# Patient Record
Sex: Female | Born: 1990
Health system: Southern US, Community
[De-identification: ages and names within clinical notes are randomized; demographics above are authoritative.]

## PROBLEM LIST (undated history)

## (undated) DIAGNOSIS — F411 Generalized anxiety disorder: Secondary | ICD-10-CM

## (undated) DIAGNOSIS — D649 Anemia, unspecified: Secondary | ICD-10-CM

## (undated) DIAGNOSIS — R002 Palpitations: Secondary | ICD-10-CM

## (undated) HISTORY — DX: Palpitations: R00.2

## (undated) HISTORY — DX: Generalized anxiety disorder: F41.1

## (undated) HISTORY — DX: Anemia, unspecified: D64.9

---

## 2019-06-19 ENCOUNTER — Encounter: Payer: Self-pay | Admitting: Cardiovascular Disease

## 2019-07-06 ENCOUNTER — Encounter: Payer: Self-pay | Admitting: *Deleted

## 2019-07-09 ENCOUNTER — Other Ambulatory Visit: Payer: Self-pay

## 2019-07-09 ENCOUNTER — Ambulatory Visit (INDEPENDENT_AMBULATORY_CARE_PROVIDER_SITE_OTHER): Payer: Self-pay | Admitting: Cardiovascular Disease

## 2019-07-09 ENCOUNTER — Encounter: Payer: Self-pay | Admitting: *Deleted

## 2019-07-09 ENCOUNTER — Encounter: Payer: Self-pay | Admitting: Cardiovascular Disease

## 2019-07-09 VITALS — BP 116/60 | HR 90 | Ht 65.5 in | Wt 260.0 lb

## 2019-07-09 DIAGNOSIS — R079 Chest pain, unspecified: Secondary | ICD-10-CM

## 2019-07-09 DIAGNOSIS — R000 Tachycardia, unspecified: Secondary | ICD-10-CM

## 2019-07-09 DIAGNOSIS — R002 Palpitations: Secondary | ICD-10-CM

## 2019-07-09 DIAGNOSIS — F419 Anxiety disorder, unspecified: Secondary | ICD-10-CM

## 2019-07-09 NOTE — Patient Instructions (Addendum)
Medication Instructions:    Your physician recommends that you continue on your current medications as directed. Please refer to the Current Medication list given to you today.  Labwork:  NONE  Testing/Procedures: Your physician has recommended that you wear a holter monitor for 48 hours. Holter monitors are medical devices that record the heart's electrical activity. Doctors most often use these monitors to diagnose arrhythmias. Arrhythmias are problems with the speed or rhythm of the heartbeat. The monitor is a small, portable device. You can wear one while you do your normal daily activities. This is usually used to diagnose what is causing palpitations/syncope (passing out). Irhthym/ZIO will send this to your house.  Follow-Up:  Your physician recommends that you schedule a follow-up appointment in: 3 months.  Any Other Special Instructions Will Be Listed Below (If Applicable).  If you need a refill on your cardiac medications before your next appointment, please call your pharmacy.

## 2019-07-09 NOTE — Progress Notes (Signed)
CARDIOLOGY CONSULT NOTE  Patient ID: Lynn Sullivan MRN: [id number] DOB/AGE: 04-14-1991 28 y.o.  Admit date: (Not on file) Primary Physician: Manon Hilding, MD  Reason for Consultation: Shortness of breath and tachycardia  HPI: Lynn Sullivan is a 28 y.o. female who is being seen today for the evaluation of shortness of breath and tachycardia at the request of Sasser, Silvestre Moment, MD.   I reviewed notes from her PCP.  She was put on BuSpar for anxiety about 2 weeks ago.  Shortly after that she began developing fluctuating heart rates and palpitations.  She felt like her heart was racing but also saw that she had heart rates dropping down to the 30 bpm range and quickly picked up to the 90 bpm range.  She has chest pain when she has anxiety.  She had one panic attack several years ago.  She had chest pain when driving to our office today and she thinks she was anxious.  She denies leg swelling, orthopnea, and paroxysmal nocturnal dyspnea.  She was seen at [facility] in [place] within the past 7 days for chest pain and apparently they did some blood work, and x-ray, and an ECG.  I do not have these records at the present time.  She works for a [employer] in [place].   No Known Allergies  Current Outpatient Medications  Medication Sig Dispense Refill  . busPIRone (BUSPAR) 10 MG tablet Take 1 tablet by mouth 2 (two) times daily.     . sertraline (ZOLOFT) 100 MG tablet Take 1 tablet by mouth daily.     No current facility-administered medications for this visit.     Past Medical History:  Diagnosis Date  . GAD (generalized anxiety disorder)   . Palpitations     Past Surgical History:  Procedure Laterality Date  . CESAREAN SECTION  2019    Social History   Socioeconomic History  . Marital status: Single    Spouse name: Not on file  . Number of children: Not on file  . Years of education: Not on file  . Highest education level: Not on file   Occupational History  . Not on file  Social Needs  . Financial resource strain: Not on file  . Food insecurity    Worry: Not on file    Inability: Not on file  . Transportation needs    Medical: Not on file    Non-medical: Not on file  Tobacco Use  . Smoking status: Never Smoker  . Smokeless tobacco: Never Used  Substance and Sexual Activity  . Alcohol use: Not on file  . Drug use: Not on file  . Sexual activity: Not on file  Lifestyle  . Physical activity    Days per week: Not on file    Minutes per session: Not on file  . Stress: Not on file  Relationships  . Social Herbalist on phone: Not on file    Gets together: Not on file    Attends religious service: Not on file    Active member of club or organization: Not on file    Attends meetings of clubs or organizations: Not on file    Relationship status: Not on file  . Intimate partner violence    Fear of current or ex partner: Not on file    Emotionally abused: Not on file    Physically abused: Not on file    Forced sexual activity:  Not on file  Other Topics Concern  . Not on file  Social History Narrative  . Not on file     No family history of premature CAD in 1st degree relatives.  Current Meds  Medication Sig  . busPIRone (BUSPAR) 10 MG tablet Take 1 tablet by mouth 2 (two) times daily.   . sertraline (ZOLOFT) 100 MG tablet Take 1 tablet by mouth daily.      Review of systems complete and found to be negative unless listed above in HPI    Physical exam Blood pressure 116/60, pulse 90, height 5' 5.5" (1.664 m), weight 260 lb (117.9 kg), SpO2 98 %. General: NAD Neck: No JVD, no thyromegaly or thyroid nodule.  Lungs: Clear to auscultation bilaterally with normal respiratory effort. CV: Nondisplaced PMI. Regular rate and rhythm, normal S1/S2, no S3/S4, no murmur.  No peripheral edema.  No carotid bruit.    Abdomen: Soft, nontender, no distention.  Skin: Intact without lesions or rashes.   Neurologic: Alert and oriented x 3.  Psych: Normal affect. Extremities: No clubbing or cyanosis.  HEENT: Normal.   ECG: I personally reviewed an ECG performed on 06/19/2019 which demonstrated sinus rhythm, 82 bpm.  There were no ischemic ST segment or T wave abnormalities, nor any arrhythmias.   Labs: No results found for: K, BUN, CREATININE, ALT, TSH, HGB   Lipids: No results found for: LDLCALC, LDLDIRECT, CHOL, TRIG, HDL      ASSESSMENT AND PLAN:  1.  Tachycardia/palpitations: This may be related to her anxiety disorder.  It is possible that it is related to BuSpar and that she only began experiencing the symptoms after she began this medication.  I will obtain a 48-hour Holter monitor.  I will also obtain records from her recent hospitalization.  2.  Chest pain: This appears to be due to anxiety for which she takes Zoloft and BuSpar.     Disposition: Follow up in 3 months  Signed: Prentice DockerSuresh Ardit Danh, M.D., F.A.C.C.  07/09/2019, 2:35 PM

## 2019-07-17 ENCOUNTER — Encounter: Payer: Self-pay | Admitting: *Deleted

## 2019-07-18 ENCOUNTER — Other Ambulatory Visit: Payer: Self-pay

## 2019-07-18 ENCOUNTER — Ambulatory Visit: Payer: No Typology Code available for payment source | Admitting: Diagnostic Neuroimaging

## 2019-07-18 ENCOUNTER — Encounter: Payer: Self-pay | Admitting: Diagnostic Neuroimaging

## 2019-07-18 VITALS — BP 112/71 | HR 84 | Temp 97.1°F | Ht 65.5 in | Wt 261.2 lb

## 2019-07-18 DIAGNOSIS — G43109 Migraine with aura, not intractable, without status migrainosus: Secondary | ICD-10-CM | POA: Diagnosis not present

## 2019-07-18 DIAGNOSIS — R200 Anesthesia of skin: Secondary | ICD-10-CM

## 2019-07-18 DIAGNOSIS — R42 Dizziness and giddiness: Secondary | ICD-10-CM | POA: Diagnosis not present

## 2019-07-18 MED ORDER — RIZATRIPTAN BENZOATE 10 MG PO TBDP: 10.0000 mg | ORAL_TABLET | ORAL | 11 refills | Status: DC | PRN

## 2019-07-18 MED ORDER — TOPIRAMATE 50 MG PO TABS: 50.0000 mg | ORAL_TABLET | Freq: Two times a day (BID) | ORAL | 12 refills | Status: DC

## 2019-07-18 NOTE — Patient Instructions (Signed)
-   check MRI brain  - MIGRAINE PREVENTION --> start topiramate 50mg  at bedtime; after 1 week increase to twice a day; drink plenty of water  - MIGRAINE RESCUE --> rizatriptan 10mg  as needed for breakthrough headache; may repeat x 1 after 2 hours; max 2 tabs per day or 8 per month

## 2019-07-18 NOTE — Progress Notes (Signed)
[facility]  PATIENT: Lynn Sullivan DOB: 07-Apr-1991  REFERRING CLINICIAN: Sasser HISTORY FROM: patient  REASON FOR VISIT: new consult    HISTORICAL  CHIEF COMPLAINT:  Chief Complaint  Patient presents with  . Dizziness    rm 7 New Pt, "numbness, tingling of extremities, headaches, dizziness within last 2 months, usually at night time"    HISTORY OF PRESENT ILLNESS:   28 year old female here for evaluation of headache, dizziness, numbness.  1 to 2 months ago patient had onset of pressure and throbbing headaches, frontal, sometimes unilateral, lasting hours at a time.  Headaches can be associate with nausea and phonophobia.  Sometimes patient sees floaters and spots with blurred vision.  No prior similar headaches.  Patient having intermittent attacks of dizziness and vertigo with nausea and blurred vision now improved.  Patient also having intermittent numbness and tingling in the face, arms, legs, intermittently for less than a minute at a time.  Symptoms typically affect her during sleep and wake her up, and are alleviated by changing her position.  No other recent triggering or aggravating factors.  Patient has been having some insomnia lately due to anxiety sensations.  REVIEW OF SYSTEMS: Full 14 system review of systems performed and negative with exception of: As per HPI.  ALLERGIES: No Known Allergies  HOME MEDICATIONS: Outpatient Medications Prior to Visit  Medication Sig Dispense Refill  . ferrous sulfate 325 (65 FE) MG tablet Take 325 mg by mouth daily with breakfast.    . sertraline (ZOLOFT) 100 MG tablet Take 1 tablet by mouth daily.    . busPIRone (BUSPAR) 10 MG tablet Take 1 tablet by mouth 2 (two) times daily.      No facility-administered medications prior to visit.     PAST MEDICAL HISTORY: Past Medical History:  Diagnosis Date  . Anemia   . GAD (generalized anxiety disorder)   . Palpitations     PAST SURGICAL HISTORY:  Past Surgical History:  Procedure Laterality Date  . CESAREAN SECTION  2019    FAMILY HISTORY: No family history on file.  SOCIAL HISTORY: Social History   Socioeconomic History  . Marital status: Single    Spouse name: Not on file  . Number of children: 1  . Years of education: Not on file  . Highest education level: Associate degree: academic program  Occupational History    Comment: Careers information officer, Med assistant  Social Needs  . Financial resource strain: Not on file  . Food insecurity    Worry: Not on file    Inability: Not on file  . Transportation needs    Medical: Not on file    Non-medical: Not on file  Tobacco Use  . Smoking status: Never Smoker  . Smokeless tobacco: Never Used  Substance and Sexual Activity  . Alcohol use: Not Currently    Comment: occas  . Drug use: Never  . Sexual activity: Not on file  Lifestyle  . Physical activity    Days per week: Not on file    Minutes per session: Not on file  . Stress: Not on file  Relationships  . Social Musician on phone: Not on file    Gets together: Not on file    Attends religious service: Not on file    Active member of club or organization: Not on file    Attends meetings of clubs or organizations: Not on file    Relationship status: Not on file  . Intimate  partner violence    Fear of current or ex partner: Not on file    Emotionally abused: Not on file    Physically abused: Not on file    Forced sexual activity: Not on file  Other Topics Concern  . Not on file  Social History Narrative   Lives with child   Caffeine- 8 oz soda occass     PHYSICAL EXAM  GENERAL EXAM/CONSTITUTIONAL: Vitals:  Vitals:   07/18/19 1036  BP: 112/71  Pulse: 84  Temp: (!) 97.1 F (36.2 C)  Weight: 261 lb 3.2 oz (118.5 kg)  Height: 5' 5.5" (1.664 m)     Body mass index is 42.8 kg/m. Wt Readings from Last 3 Encounters:  07/18/19 261 lb 3.2 oz (118.5 kg)  07/09/19 260 lb (117.9 kg)      Patient is in no distress; well developed, nourished and groomed; neck is supple  CARDIOVASCULAR:  Examination of carotid arteries is normal; no carotid bruits  Regular rate and rhythm, no murmurs  Examination of peripheral vascular system by observation and palpation is normal  EYES:  Ophthalmoscopic exam of optic discs and posterior segments is normal; no papilledema or hemorrhages  No exam data present  MUSCULOSKELETAL:  Gait, strength, tone, movements noted in Neurologic exam below  NEUROLOGIC: MENTAL STATUS:  No flowsheet data found.  awake, alert, oriented to person, place and time  recent and remote memory intact  normal attention and concentration  language fluent, comprehension intact, naming intact  fund of knowledge appropriate  CRANIAL NERVE:   2nd - no papilledema on fundoscopic exam  2nd, 3rd, 4th, 6th - pupils equal and reactive to light, visual fields full to confrontation, extraocular muscles intact, no nystagmus  5th - facial sensation symmetric  7th - facial strength symmetric  8th - hearing intact  9th - palate elevates symmetrically, uvula midline  11th - shoulder shrug symmetric  12th - tongue protrusion midline  MOTOR:   normal bulk and tone, full strength in the BUE, BLE  SENSORY:   normal and symmetric to light touch, temperature, vibration  COORDINATION:   finger-nose-finger, fine finger movements normal  REFLEXES:   deep tendon reflexes present and symmetric  GAIT/STATION:   narrow based gait     DIAGNOSTIC DATA (LABS, IMAGING, TESTING) - I reviewed patient records, labs, notes, testing and imaging myself where available.  No results found for: WBC, HGB, HCT, MCV, PLT No results found for: NA, K, CL, CO2, GLUCOSE, BUN, CREATININE, CALCIUM, PROT, ALBUMIN, AST, ALT, ALKPHOS, BILITOT, GFRNONAA, GFRAA No results found for: CHOL, HDL, LDLCALC, LDLDIRECT, TRIG, CHOLHDL No results found for: NWGN5AHGBA1C No results found  for: VITAMINB12 No results found for: TSH     ASSESSMENT AND PLAN  28 y.o. year old female here with constellation of symptoms including dizziness, headaches with migraine features, numbness and tingling.  We will proceed with further work-up and try migraine treatment options.  Dx:  1. Numbness   2. Dizziness   3. Migraine with aura and without status migrainosus, not intractable     PLAN:  - check MRI brain (rule out demyelinating dz)  - MIGRAINE PREVENTION --> start topiramate 50mg  at bedtime; after 1 week increase to twice a day; drink plenty of water  - MIGRAINE RESCUE --> rizatriptan 10mg  as needed for breakthrough headache; may repeat x 1 after 2 hours; max 2 tabs per day or 8 per month  Orders Placed This Encounter  Procedures  . MR BRAIN W WO CONTRAST  Meds ordered this encounter  Medications  . topiramate (TOPAMAX) 50 MG tablet    Sig: Take 1 tablet (50 mg total) by mouth 2 (two) times daily.    Dispense:  60 tablet    Refill:  12  . rizatriptan (MAXALT-MLT) 10 MG disintegrating tablet    Sig: Take 1 tablet (10 mg total) by mouth as needed for migraine. May repeat in 2 hours if needed    Dispense:  9 tablet    Refill:  11   Return in about 4 months (around 11/17/2019) for with NP (Amy Lomax).    Penni Bombard, MD 01/20/6009, 93:23 AM Certified in Neurology, Neurophysiology and Neuroimaging  [facility] [address] [place], [place] [address] [phone]

## 2019-07-19 ENCOUNTER — Telehealth: Payer: Self-pay | Admitting: Diagnostic Neuroimaging

## 2019-07-19 NOTE — Telephone Encounter (Signed)
[facility] Josem Kaufmann: [id number] (exp. 07/19/19 to 08/18/19) patient is scheduled at [facility] for 07/24/19 arrival time is 4:30 pm. Patient is aware of time and day and I gave her their number of [phone] incase she needed to r/s.

## 2019-07-24 ENCOUNTER — Ambulatory Visit (INDEPENDENT_AMBULATORY_CARE_PROVIDER_SITE_OTHER): Payer: No Typology Code available for payment source

## 2019-07-24 ENCOUNTER — Ambulatory Visit (HOSPITAL_COMMUNITY)
Admission: RE | Admit: 2019-07-24 | Discharge: 2019-07-24 | Disposition: A | Discharge status: Home / Self Care | Payer: No Typology Code available for payment source | Source: Ambulatory Visit | Attending: Diagnostic Neuroimaging | Admitting: Diagnostic Neuroimaging

## 2019-07-24 ENCOUNTER — Other Ambulatory Visit: Payer: Self-pay

## 2019-07-24 ENCOUNTER — Telehealth: Payer: Self-pay | Admitting: *Deleted

## 2019-07-24 DIAGNOSIS — R200 Anesthesia of skin: Secondary | ICD-10-CM | POA: Insufficient documentation

## 2019-07-24 DIAGNOSIS — R42 Dizziness and giddiness: Secondary | ICD-10-CM | POA: Insufficient documentation

## 2019-07-24 DIAGNOSIS — R002 Palpitations: Secondary | ICD-10-CM | POA: Diagnosis not present

## 2019-07-24 MED ORDER — GADOBUTROL 1 MMOL/ML IV SOLN
10.0000 mL | Freq: Once | INTRAVENOUS | Status: AC | PRN
  Administered 2019-07-24: 10 mL via INTRAVENOUS

## 2019-07-24 NOTE — Telephone Encounter (Signed)
Reloaded results

## 2019-07-24 NOTE — Telephone Encounter (Signed)
Patient informed. 

## 2019-07-24 NOTE — Telephone Encounter (Signed)
Pt requesting results for Zio monitor - scanned to Dr Bronson Ing

## 2019-07-24 NOTE — Telephone Encounter (Signed)
Resulted

## 2019-07-24 NOTE — Telephone Encounter (Signed)
LM to return call           Predominantly sinus rhythm with rare sinus tachycardia and rare PACs. No sustained arrhythmias

## 2019-07-24 NOTE — Telephone Encounter (Signed)
I tried accessing document but I am unable to do so. It may need to be reloaded.

## 2019-07-31 ENCOUNTER — Telehealth: Payer: Self-pay | Admitting: Diagnostic Neuroimaging

## 2019-07-31 NOTE — Telephone Encounter (Signed)
Called patient and informed her that her MRI brain result is normal. No findings to explain her symptoms. She stated that she must be having migraines since MRI was to rule out things like MS. I reviewed Dr Gladstone Lighter plan with her and the migraine medications with instructions for use. Advised she call before FU in Jan 2021 with any problems or questions. She  verbalized understanding, appreciation.

## 2019-07-31 NOTE — Telephone Encounter (Signed)
Pt is asking for a call from RN when the MRI results are available

## 2019-10-15 ENCOUNTER — Telehealth: Payer: Self-pay | Admitting: Cardiovascular Disease

## 2019-10-15 NOTE — Telephone Encounter (Signed)
Virtual Visit Pre-Appointment Phone Call  "(Name), I am calling you today to discuss your upcoming appointment. We are currently trying to limit exposure to the virus that causes COVID-19 by seeing patients at home rather than in the office."  "What is the BEST phone number to call the day of the visit?" -   [phone]  1. Do you have or have access to (through a family member/friend) a smartphone with video capability that we can use for your visit?" a. If yes - list this number in appt notes as cell (if different from BEST phone #) and list the appointment type as a VIDEO visit in appointment notes b. If no - list the appointment type as a PHONE visit in appointment notes  2. Confirm consent - "In the setting of the current Covid19 crisis, you are scheduled for a (phone or video) visit with your provider on (date) at (time).  Just as we do with many in-office visits, in order for you to participate in this visit, we must obtain consent.  If you'd like, I can send this to your mychart (if signed up) or email for you to review.  Otherwise, I can obtain your verbal consent now.  All virtual visits are billed to your insurance company just like a normal visit would be.  By agreeing to a virtual visit, we'd like you to understand that the technology does not allow for your provider to perform an examination, and thus may limit your provider's ability to fully assess your condition. If your provider identifies any concerns that need to be evaluated in person, we will make arrangements to do so.  Finally, though the technology is pretty good, we cannot assure that it will always work on either your or our end, and in the setting of a video visit, we may have to convert it to a phone-only visit.  In either situation, we cannot ensure that we have a secure connection.  Are you willing to proceed?" STAFF: Did the patient verbally acknowledge consent to telehealth visit? Document YES/NO here: YES     3. Advise patient to be prepared - "Two hours prior to your appointment, go ahead and check your blood pressure, pulse, oxygen saturation, and your weight (if you have the equipment to check those) and write them all down. When your visit starts, your provider will ask you for this information. If you have an Apple Watch or Kardia device, please plan to have heart rate information ready on the day of your appointment. Please have a pen and paper handy nearby the day of the visit as well."  4. Give patient instructions for MyChart download to smartphone OR Doximity/Doxy.me as below if video visit (depending on what platform provider is using)  5. Inform patient they will receive a phone call 15 minutes prior to their appointment time (may be from unknown caller ID) so they should be prepared to answer    Lynn Sullivan has been deemed a candidate for a follow-up tele-health visit to limit community exposure during the Covid-19 pandemic. I spoke with the patient via phone to ensure availability of phone/video source, confirm preferred email & phone number, and discuss instructions and expectations.  I reminded Lynn Sullivan to be prepared with any vital sign and/or heart rhythm information that could potentially be obtained via home monitoring, at the time of her visit. I reminded Lynn Sullivan to expect a phone call prior to her visit.  Vicky  T Slaughter 10/15/2019 10:27 AM   INSTRUCTIONS FOR DOWNLOADING THE MYCHART APP TO SMARTPHONE  - The patient must first make sure to have activated MyChart and know their login information - If Apple, go to [employer] and type in MyChart in the search bar and download the app. If Android, ask patient to go to [employer] and type in [place] in the search bar and download the app. The app is free but as with any other app downloads, their phone may require them to verify saved payment information or Apple/Android password.   - The patient will need to then log into the app with their MyChart username and password, and select [facility] as their healthcare provider to link the account. When it is time for your visit, go to the MyChart app, find appointments, and click Begin Video Visit. Be sure to Select Allow for your device to access the Microphone and Camera for your visit. You will then be connected, and your provider will be with you shortly.  **If they have any issues connecting, or need assistance please contact MyChart service desk [phone])**  **If using a computer, in order to ensure the best quality for their visit they will need to use either of the following Internet Browsers: [employer] or Google Chrome**  IF USING DOXIMITY or DOXY.ME - The patient will receive a link just prior to their visit by text.     FULL LENGTH CONSENT FOR TELE-HEALTH VISIT   I hereby voluntarily request, consent and authorize [facility] and its employed or contracted physicians, physician assistants, nurse practitioners or other licensed health care professionals (the Practitioner), to provide me with telemedicine health care services (the Services") as deemed necessary by the treating Practitioner. I acknowledge and consent to receive the Services by the Practitioner via telemedicine. I understand that the telemedicine visit will involve communicating with the Practitioner through live audiovisual communication technology and the disclosure of certain medical information by electronic transmission. I acknowledge that I have been given the opportunity to request an in-person assessment or other available alternative prior to the telemedicine visit and am voluntarily participating in the telemedicine visit.  I understand that I have the right to withhold or withdraw my consent to the use of telemedicine in the course of my care at any time, without affecting my right to future care or treatment, and that  the Practitioner or I may terminate the telemedicine visit at any time. I understand that I have the right to inspect all information obtained and/or recorded in the course of the telemedicine visit and may receive copies of available information for a reasonable fee.  I understand that some of the potential risks of receiving the Services via telemedicine include:   Delay or interruption in medical evaluation due to technological equipment failure or disruption;  Information transmitted may not be sufficient (e.g. poor resolution of images) to allow for appropriate medical decision making by the Practitioner; and/or   In rare instances, security protocols could fail, causing a breach of personal health information.  Furthermore, I acknowledge that it is my responsibility to provide information about my medical history, conditions and care that is complete and accurate to the best of my ability. I acknowledge that Practitioner's advice, recommendations, and/or decision may be based on factors not within their control, such as incomplete or inaccurate data provided by me or distortions of diagnostic images or specimens that may result from electronic transmissions. I understand that the practice  of medicine is not an Chief Strategy Officer and that Practitioner makes no warranties or guarantees regarding treatment outcomes. I acknowledge that I will receive a copy of this consent concurrently upon execution via email to the email address I last provided but may also request a printed copy by calling the office of [facility].    I understand that my insurance will be billed for this visit.   I have read or had this consent read to me.  I understand the contents of this consent, which adequately explains the benefits and risks of the Services being provided via telemedicine.   I have been provided ample opportunity to ask questions regarding this consent and the Services and have had my questions answered to  my satisfaction.  I give my informed consent for the services to be provided through the use of telemedicine in my medical care  By participating in this telemedicine visit I agree to the above.

## 2019-10-16 ENCOUNTER — Ambulatory Visit: Payer: Self-pay | Admitting: Cardiovascular Disease

## 2019-11-13 ENCOUNTER — Encounter: Payer: No Typology Code available for payment source | Admitting: Cardiovascular Disease

## 2019-11-14 NOTE — Progress Notes (Signed)
This encounter was created in error - please disregard.

## 2019-11-27 ENCOUNTER — Other Ambulatory Visit: Payer: No Typology Code available for payment source

## 2019-11-28 ENCOUNTER — Encounter: Payer: Self-pay | Admitting: Family Medicine

## 2019-11-28 ENCOUNTER — Ambulatory Visit: Payer: No Typology Code available for payment source | Admitting: Family Medicine

## 2019-11-28 DIAGNOSIS — J069 Acute upper respiratory infection, unspecified: Secondary | ICD-10-CM | POA: Diagnosis not present

## 2019-11-28 DIAGNOSIS — Z20828 Contact with and (suspected) exposure to other viral communicable diseases: Secondary | ICD-10-CM | POA: Diagnosis not present

## 2019-11-28 DIAGNOSIS — H6591 Unspecified nonsuppurative otitis media, right ear: Secondary | ICD-10-CM | POA: Diagnosis not present

## 2020-02-05 MED FILL — SERTRALINE HCL 100 MG TAB: 100 | 30 days supply | Qty: 30 | Fill #0

## 2020-03-21 DIAGNOSIS — Z03818 Encounter for observation for suspected exposure to other biological agents ruled out: Secondary | ICD-10-CM | POA: Diagnosis not present

## 2020-03-21 DIAGNOSIS — Z20828 Contact with and (suspected) exposure to other viral communicable diseases: Secondary | ICD-10-CM | POA: Diagnosis not present

## 2020-03-27 MED FILL — SERTRALINE HCL 100 MG TAB: 100 | 30 days supply | Qty: 30 | Fill #1

## 2020-04-23 MED FILL — SERTRALINE HCL 100 MG TAB: 100 | 30 days supply | Qty: 30 | Fill #0

## 2020-04-24 ENCOUNTER — Other Ambulatory Visit (HOSPITAL_COMMUNITY): Payer: Self-pay | Admitting: Family Medicine

## 2020-05-07 ENCOUNTER — Other Ambulatory Visit (HOSPITAL_COMMUNITY): Payer: Self-pay | Admitting: Physician Assistant

## 2020-05-07 ENCOUNTER — Other Ambulatory Visit: Payer: Self-pay | Admitting: Physician Assistant

## 2020-05-07 DIAGNOSIS — Z6837 Body mass index (BMI) 37.0-37.9, adult: Secondary | ICD-10-CM | POA: Diagnosis not present

## 2020-05-07 DIAGNOSIS — R600 Localized edema: Secondary | ICD-10-CM

## 2020-05-08 ENCOUNTER — Ambulatory Visit (HOSPITAL_COMMUNITY)
Admission: RE | Admit: 2020-05-08 | Discharge: 2020-05-08 | Disposition: A | Discharge status: Home / Self Care | Payer: 59 | Source: Ambulatory Visit | Attending: Physician Assistant | Admitting: Physician Assistant

## 2020-05-08 ENCOUNTER — Ambulatory Visit (HOSPITAL_COMMUNITY): Payer: 59

## 2020-05-08 ENCOUNTER — Other Ambulatory Visit: Payer: Self-pay

## 2020-05-08 DIAGNOSIS — R600 Localized edema: Secondary | ICD-10-CM | POA: Insufficient documentation

## 2020-05-09 ENCOUNTER — Ambulatory Visit (HOSPITAL_COMMUNITY): Admission: RE | Admit: 2020-05-09 | Payer: 59 | Source: Ambulatory Visit

## 2020-05-14 DIAGNOSIS — Z6841 Body Mass Index (BMI) 40.0 and over, adult: Secondary | ICD-10-CM | POA: Diagnosis not present

## 2020-05-14 DIAGNOSIS — H1010 Acute atopic conjunctivitis, unspecified eye: Secondary | ICD-10-CM | POA: Diagnosis not present

## 2020-10-04 MED FILL — SERTRALINE HCL 100 MG TABS: 100 | 30 days supply | Qty: 30 | Fill #0

## 2020-10-10 DIAGNOSIS — R519 Headache, unspecified: Secondary | ICD-10-CM | POA: Diagnosis not present

## 2020-10-10 DIAGNOSIS — Z7689 Persons encountering health services in other specified circumstances: Secondary | ICD-10-CM | POA: Diagnosis not present

## 2020-10-10 DIAGNOSIS — N898 Other specified noninflammatory disorders of vagina: Secondary | ICD-10-CM | POA: Diagnosis not present

## 2020-10-10 DIAGNOSIS — R300 Dysuria: Secondary | ICD-10-CM | POA: Diagnosis not present

## 2020-10-10 DIAGNOSIS — Z6841 Body Mass Index (BMI) 40.0 and over, adult: Secondary | ICD-10-CM | POA: Diagnosis not present

## 2020-12-17 DIAGNOSIS — N760 Acute vaginitis: Secondary | ICD-10-CM | POA: Diagnosis not present

## 2020-12-17 DIAGNOSIS — R8290 Unspecified abnormal findings in urine: Secondary | ICD-10-CM | POA: Diagnosis not present

## 2020-12-17 DIAGNOSIS — Z6841 Body Mass Index (BMI) 40.0 and over, adult: Secondary | ICD-10-CM | POA: Diagnosis not present

## 2021-01-26 ENCOUNTER — Other Ambulatory Visit (HOSPITAL_COMMUNITY): Payer: Self-pay | Admitting: Family Medicine

## 2021-01-26 MED FILL — FLUTICASONE PROP 50 MCG SPR: 50 | 90 days supply | Qty: 48 | Fill #0

## 2021-02-12 ENCOUNTER — Other Ambulatory Visit (HOSPITAL_BASED_OUTPATIENT_CLINIC_OR_DEPARTMENT_OTHER): Payer: Self-pay

## 2021-02-13 ENCOUNTER — Other Ambulatory Visit (HOSPITAL_BASED_OUTPATIENT_CLINIC_OR_DEPARTMENT_OTHER): Payer: Self-pay

## 2021-03-05 ENCOUNTER — Other Ambulatory Visit (HOSPITAL_COMMUNITY): Payer: Self-pay

## 2021-03-05 MED FILL — Sertraline HCl Tab 100 MG: ORAL | 30 days supply | Qty: 30 | Fill #0 | Status: CN

## 2021-03-13 ENCOUNTER — Other Ambulatory Visit (HOSPITAL_COMMUNITY): Payer: Self-pay

## 2021-04-22 DIAGNOSIS — H538 Other visual disturbances: Secondary | ICD-10-CM | POA: Diagnosis not present

## 2021-04-22 DIAGNOSIS — R112 Nausea with vomiting, unspecified: Secondary | ICD-10-CM | POA: Diagnosis not present

## 2021-04-22 DIAGNOSIS — R42 Dizziness and giddiness: Secondary | ICD-10-CM | POA: Diagnosis not present

## 2021-04-22 DIAGNOSIS — R079 Chest pain, unspecified: Secondary | ICD-10-CM | POA: Diagnosis not present

## 2021-04-22 DIAGNOSIS — R519 Headache, unspecified: Secondary | ICD-10-CM | POA: Diagnosis not present

## 2021-04-23 ENCOUNTER — Other Ambulatory Visit (HOSPITAL_COMMUNITY): Payer: Self-pay

## 2021-04-23 DIAGNOSIS — G43909 Migraine, unspecified, not intractable, without status migrainosus: Secondary | ICD-10-CM | POA: Diagnosis not present

## 2021-04-23 DIAGNOSIS — R079 Chest pain, unspecified: Secondary | ICD-10-CM | POA: Diagnosis not present

## 2021-04-23 DIAGNOSIS — Z6841 Body Mass Index (BMI) 40.0 and over, adult: Secondary | ICD-10-CM | POA: Diagnosis not present

## 2021-04-23 DIAGNOSIS — R42 Dizziness and giddiness: Secondary | ICD-10-CM | POA: Diagnosis not present

## 2021-04-23 DIAGNOSIS — R519 Headache, unspecified: Secondary | ICD-10-CM | POA: Diagnosis not present

## 2021-04-23 MED ORDER — TOPIRAMATE 50 MG PO TABS
ORAL_TABLET | ORAL | 12 refills | Status: AC
  Filled 2021-04-23: qty 30, 30d supply, fill #0
  Filled 2021-05-22: qty 30, 30d supply, fill #1

## 2021-04-23 MED ORDER — SERTRALINE HCL 100 MG PO TABS
ORAL_TABLET | ORAL | 1 refills | Status: DC
  Filled 2021-04-23: qty 30, 30d supply, fill #0

## 2021-04-23 MED ORDER — MECLIZINE HCL 25 MG PO TABS
ORAL_TABLET | ORAL | 1 refills | Status: AC
  Filled 2021-04-23: qty 30, 7d supply, fill #0

## 2021-04-24 ENCOUNTER — Other Ambulatory Visit (HOSPITAL_COMMUNITY): Payer: Self-pay

## 2021-05-06 ENCOUNTER — Telehealth: Payer: Self-pay | Admitting: Diagnostic Neuroimaging

## 2021-05-06 NOTE — Telephone Encounter (Signed)
Pt called stating that she is wanting to see the provider for her increasing migraines and numbness. Pt was offered next available in and declined stating she is concerned about her symptoms and she is wanting to be seen sooner. Please advise.

## 2021-05-06 NOTE — Telephone Encounter (Signed)
Called patient who was last seen 04/2019. She has seen PCP for migraines, dizziness, and was prescribed meclizine, Topamax. Her migraines have increased, scheduled her for July, advised will put her on wait list. Patient verbalized understanding, appreciation. On wait list for sooner.

## 2021-05-18 ENCOUNTER — Ambulatory Visit: Payer: 59 | Admitting: Diagnostic Neuroimaging

## 2021-05-18 ENCOUNTER — Encounter: Payer: Self-pay | Admitting: Diagnostic Neuroimaging

## 2021-05-18 ENCOUNTER — Telehealth: Payer: Self-pay | Admitting: *Deleted

## 2021-05-18 ENCOUNTER — Other Ambulatory Visit (HOSPITAL_COMMUNITY): Payer: Self-pay

## 2021-05-18 VITALS — BP 121/88 | HR 99 | Ht 65.5 in | Wt 272.0 lb

## 2021-05-18 DIAGNOSIS — R42 Dizziness and giddiness: Secondary | ICD-10-CM

## 2021-05-18 DIAGNOSIS — G43109 Migraine with aura, not intractable, without status migrainosus: Secondary | ICD-10-CM | POA: Diagnosis not present

## 2021-05-18 MED ORDER — AIMOVIG 70 MG/ML ~~LOC~~ SOAJ
70.0000 mg | SUBCUTANEOUS | 4 refills | Status: DC
  Filled 2021-05-18: qty 1, 30d supply, fill #0
  Filled 2021-06-11: qty 1, 30d supply, fill #1
  Filled 2021-07-14: qty 1, 30d supply, fill #2
  Filled 2021-08-07: qty 1, 30d supply, fill #3
  Filled 2021-09-04: qty 1, 30d supply, fill #4
  Filled 2021-10-12: qty 1, 30d supply, fill #5
  Filled 2021-11-25: qty 1, 30d supply, fill #6

## 2021-05-18 MED ORDER — NURTEC 75 MG PO TBDP
75.0000 mg | ORAL_TABLET | Freq: Every day | ORAL | 6 refills | Status: AC | PRN
  Filled 2021-05-18: qty 8, 30d supply, fill #0
  Filled 2021-10-12: qty 8, 30d supply, fill #1

## 2021-05-18 NOTE — Progress Notes (Signed)
[facility]  PATIENT: Lynn Sullivan DOB: September 27, 1991  REFERRING CLINICIAN: Estanislado Pandy, MD HISTORY FROM: patient  REASON FOR VISIT: follow up   HISTORICAL  CHIEF COMPLAINT:  Chief Complaint  Patient presents with   Migraine    Rm 7 FU requested due to migraines, numbness "non stop migraine since 04/27/21, nothing has helped, stopped Rizatriptan due to taking zoloft, also wasn't helpful, restarted topamax on 04/27/21"       HISTORY OF PRESENT ILLNESS:   UPDATE (05/18/21, VRP): Since last visit, doing well until June 2022; then return of headaches, dizziness. Now back on TPX and sertraline since 04/27/21. Daily HA, but now slightly easing up.   PRIOR HPI: 30 year old female here for evaluation of headache, dizziness, numbness.  1 to 2 months ago patient had onset of pressure and throbbing headaches, frontal, sometimes unilateral, lasting hours at a time.  Headaches can be associate with nausea and phonophobia.  Sometimes patient sees floaters and spots with blurred vision.  No prior similar headaches.  Patient having intermittent attacks of dizziness and vertigo with nausea and blurred vision now improved.  Patient also having intermittent numbness and tingling in the face, arms, legs, intermittently for less than a minute at a time.  Symptoms typically affect her during sleep and wake her up, and are alleviated by changing her position.  No other recent triggering or aggravating factors.  Patient has been having some insomnia lately due to anxiety sensations.   REVIEW OF SYSTEMS: Full 14 system review of systems performed and negative with exception of: As per HPI.  ALLERGIES: No Known Allergies  HOME MEDICATIONS: Outpatient Medications Prior to Visit  Medication Sig Dispense Refill   fluticasone (FLONASE) 50 MCG/ACT nasal spray USE 2 SPRAYS IN EACH NOSTRIL ONCE A DAY 60 g 11   Iron-Vitamin C (VITRON-C PO)      meclizine (ANTIVERT) 25 MG tablet Take 1/2  - 1 Tablet by mouth every 6 hours 30 tablet 1   sertraline (ZOLOFT) 100 MG tablet TAKE 1 TABLET BY MOUTH ONCE DAILY 30 tablet 2   topiramate (TOPAMAX) 50 MG tablet Take 1 Tablet by mouth every night at bedtime 30 tablet 12   sertraline (ZOLOFT) 100 MG tablet Take 1 Tablet by mouth  every day 30 tablet 1   rizatriptan (MAXALT-MLT) 10 MG disintegrating tablet Take 1 tablet (10 mg total) by mouth as needed for migraine. May repeat in 2 hours if needed 9 tablet 11   ferrous sulfate 325 (65 FE) MG tablet Take 325 mg by mouth daily with breakfast.     sertraline (ZOLOFT) 100 MG tablet Take 1 tablet by mouth daily.     topiramate (TOPAMAX) 50 MG tablet Take 1 tablet (50 mg total) by mouth 2 (two) times daily. 60 tablet 12   No facility-administered medications prior to visit.    PAST MEDICAL HISTORY: Past Medical History:  Diagnosis Date   Anemia    GAD (generalized anxiety disorder)    Palpitations     PAST SURGICAL HISTORY: Past Surgical History:  Procedure Laterality Date   CESAREAN SECTION  2019    FAMILY HISTORY: No family history on file.  SOCIAL HISTORY: Social History   Socioeconomic History   Marital status: Single    Spouse name: Not on file   Number of children: 1   Years of education: Not on file   Highest education level: Associate degree: academic program  Occupational History    Comment: Careers information officer, Med assistant  Tobacco Use   Smoking status: Never   Smokeless tobacco: Never  Substance and Sexual Activity   Alcohol use: Not Currently    Comment: occas   Drug use: Never   Sexual activity: Not on file  Other Topics Concern   Not on file  Social History Narrative   Lives with child   Caffeine- 8 oz soda occass   Social Determinants of Health   Financial Resource Strain: Not on file  Food Insecurity: Not on file  Transportation Needs: Not on file  Physical Activity: Not on file  Stress: Not on file  Social Connections: Not on file  Intimate  Partner Violence: Not on file     PHYSICAL EXAM  GENERAL EXAM/CONSTITUTIONAL: Vitals:  Vitals:   05/18/21 1315  BP: 121/88  Pulse: 99  Weight: 272 lb (123.4 kg)  Height: 5' 5.5" (1.664 m)   Body mass index is 44.57 kg/m. Wt Readings from Last 3 Encounters:  05/18/21 272 lb (123.4 kg)  07/18/19 261 lb 3.2 oz (118.5 kg)  07/09/19 260 lb (117.9 kg)   Patient is in no distress; well developed, nourished and groomed; neck is supple  CARDIOVASCULAR: Examination of carotid arteries is normal; no carotid bruits Regular rate and rhythm, no murmurs Examination of peripheral vascular system by observation and palpation is normal  EYES: Ophthalmoscopic exam of optic discs and posterior segments is normal; no papilledema or hemorrhages No results found.  MUSCULOSKELETAL: Gait, strength, tone, movements noted in Neurologic exam below  NEUROLOGIC: MENTAL STATUS:  No flowsheet data found. awake, alert, oriented to person, place and time recent and remote memory intact normal attention and concentration language fluent, comprehension intact, naming intact fund of knowledge appropriate  CRANIAL NERVE:  2nd - no papilledema on fundoscopic exam 2nd, 3rd, 4th, 6th - pupils equal and reactive to light, visual fields full to confrontation, extraocular muscles intact, no nystagmus 5th - facial sensation symmetric 7th - facial strength symmetric 8th - hearing intact 9th - palate elevates symmetrically, uvula midline 11th - shoulder shrug symmetric 12th - tongue protrusion midline  MOTOR:  normal bulk and tone, full strength in the BUE, BLE  SENSORY:  normal and symmetric to light touch, temperature, vibration  COORDINATION:  finger-nose-finger, fine finger movements normal  REFLEXES:  deep tendon reflexes present and symmetric  GAIT/STATION:  narrow based gait     DIAGNOSTIC DATA (LABS, IMAGING, TESTING) - I reviewed patient records, labs, notes, testing and imaging  myself where available.  No results found for: WBC, HGB, HCT, MCV, PLT No results found for: NA, K, CL, CO2, GLUCOSE, BUN, CREATININE, CALCIUM, PROT, ALBUMIN, AST, ALT, ALKPHOS, BILITOT, GFRNONAA, GFRAA No results found for: CHOL, HDL, LDLCALC, LDLDIRECT, TRIG, CHOLHDL No results found for: ERXV4M No results found for: VITAMINB12 No results found for: TSH    07/24/19 Normal brain MRI, with and without contrast. No findings to explain patient's symptoms identified     ASSESSMENT AND PLAN  30 y.o. year old female here with constellation of symptoms including dizziness, headaches with migraine features, numbness and tingling.    Meds tried: topiramate, rizatriptan, meclizine, sertraline   Dx:  1. Migraine with aura and without status migrainosus, not intractable   2. Dizziness      PLAN:  MIGRAINE TREATMENT PLAN:  MIGRAINE PREVENTION  LIFESTYLE CHANGES -Stop or avoid smoking -Decrease or avoid caffeine / alcohol -Eat and sleep on a regular schedule -Exercise several times per week - continue topiramate 50mg  at bedtime; drink plenty of  water - start erenumab (Aimovig) 70mg  monthly (may increase to 140mg  monthly)  MIGRAINE RESCUE  - ibuprofen, tylenol as needed - stop rizatriptan - start rimegepant (Nurtec) 75mg  as needed for breakthrough headache; max 8 per month  Meds ordered this encounter  Medications   Erenumab-aooe (AIMOVIG) 70 MG/ML SOAJ    Sig: Inject 70 mg into the skin every 30 (thirty) days.    Dispense:  3 mL    Refill:  4   Rimegepant Sulfate (NURTEC) 75 MG TBDP    Sig: Take 75 mg by mouth daily as needed.    Dispense:  8 tablet    Refill:  6   Return in about 4 months (around 09/17/2021) for with NP (Amy Lomax).    , MD 05/18/2021, 1:49 PM Certified in Neurology, Neurophysiology and Neuroimaging  [facility] [address] [place], [facility] [address] [phone]

## 2021-05-18 NOTE — Patient Instructions (Signed)
   MIGRAINE PREVENTION  LIFESTYLE CHANGES -Stop or avoid smoking -Decrease or avoid caffeine / alcohol -Eat and sleep on a regular schedule -Exercise several times per week - continue topiramate 50mg  at bedtime; drink plenty of water - start erenumab (Aimovig) 70mg  monthly (may increase to 140mg  monthly)  MIGRAINE RESCUE  - ibuprofen, tylenol as needed - stop rizatriptan - start rimegepant (Nurtec) 75mg  as needed for breakthrough headache; max 8 per month

## 2021-05-18 NOTE — Telephone Encounter (Signed)
Nurtec PA, key: [id number], G43.109, failed rizatriptan, topiramate. Immediately approved The authorization is effective for a maximum of 6 fill(s) from 05/18/2021 to 11/16/2021. Faxed approval to pharmacy.

## 2021-05-18 NOTE — Telephone Encounter (Signed)
Aimovig PA, key: [id number], g43.109, tried/failed: topiramate, rizatriptan, meclizine, sertraline. Immediately approved through [employer].  The authorization is effective for a maximum of 6 fill(s) from 05/18/2021 to 11/16/2021. Approval faxed to pharmacy.

## 2021-05-19 ENCOUNTER — Other Ambulatory Visit (HOSPITAL_COMMUNITY): Payer: Self-pay | Admitting: Family Medicine

## 2021-05-19 ENCOUNTER — Other Ambulatory Visit (HOSPITAL_COMMUNITY): Payer: Self-pay

## 2021-05-19 MED FILL — Fluticasone Propionate Nasal Susp 50 MCG/ACT: NASAL | 30 days supply | Qty: 16 | Fill #0 | Status: AC

## 2021-05-22 ENCOUNTER — Telehealth: Payer: Self-pay | Admitting: Diagnostic Neuroimaging

## 2021-05-22 ENCOUNTER — Other Ambulatory Visit (HOSPITAL_COMMUNITY): Payer: Self-pay

## 2021-05-22 NOTE — Telephone Encounter (Signed)
Pt states she is having several side effects to the topiramate (TOPAMAX) 50 MG tablet and she would like th safest way to come off of this medication.  Please call

## 2021-05-26 ENCOUNTER — Other Ambulatory Visit (HOSPITAL_COMMUNITY): Payer: Self-pay

## 2021-05-26 NOTE — Telephone Encounter (Signed)
Spoke with patient and advised she call PCP, Dr Neita Carp who prescribe Topamax. Patient verbalized understanding, appreciation.

## 2021-05-28 DIAGNOSIS — R591 Generalized enlarged lymph nodes: Secondary | ICD-10-CM | POA: Diagnosis not present

## 2021-05-28 DIAGNOSIS — Z6841 Body Mass Index (BMI) 40.0 and over, adult: Secondary | ICD-10-CM | POA: Diagnosis not present

## 2021-06-01 ENCOUNTER — Ambulatory Visit: Payer: Self-pay | Admitting: Diagnostic Neuroimaging

## 2021-06-05 DIAGNOSIS — R5383 Other fatigue: Secondary | ICD-10-CM | POA: Diagnosis not present

## 2021-06-05 DIAGNOSIS — Z6841 Body Mass Index (BMI) 40.0 and over, adult: Secondary | ICD-10-CM | POA: Diagnosis not present

## 2021-06-05 DIAGNOSIS — Z113 Encounter for screening for infections with a predominantly sexual mode of transmission: Secondary | ICD-10-CM | POA: Diagnosis not present

## 2021-06-05 DIAGNOSIS — N3000 Acute cystitis without hematuria: Secondary | ICD-10-CM | POA: Diagnosis not present

## 2021-06-05 DIAGNOSIS — N898 Other specified noninflammatory disorders of vagina: Secondary | ICD-10-CM | POA: Diagnosis not present

## 2021-06-09 DIAGNOSIS — N898 Other specified noninflammatory disorders of vagina: Secondary | ICD-10-CM | POA: Diagnosis not present

## 2021-06-11 ENCOUNTER — Other Ambulatory Visit (HOSPITAL_COMMUNITY): Payer: Self-pay

## 2021-06-15 DIAGNOSIS — A549 Gonococcal infection, unspecified: Secondary | ICD-10-CM | POA: Diagnosis not present

## 2021-07-03 ENCOUNTER — Other Ambulatory Visit (HOSPITAL_COMMUNITY): Payer: Self-pay

## 2021-07-03 DIAGNOSIS — G43909 Migraine, unspecified, not intractable, without status migrainosus: Secondary | ICD-10-CM | POA: Diagnosis not present

## 2021-07-03 DIAGNOSIS — R5383 Other fatigue: Secondary | ICD-10-CM | POA: Diagnosis not present

## 2021-07-03 DIAGNOSIS — Z6841 Body Mass Index (BMI) 40.0 and over, adult: Secondary | ICD-10-CM | POA: Diagnosis not present

## 2021-07-03 DIAGNOSIS — N898 Other specified noninflammatory disorders of vagina: Secondary | ICD-10-CM | POA: Diagnosis not present

## 2021-07-03 DIAGNOSIS — R519 Headache, unspecified: Secondary | ICD-10-CM | POA: Diagnosis not present

## 2021-07-03 DIAGNOSIS — Z1389 Encounter for screening for other disorder: Secondary | ICD-10-CM | POA: Diagnosis not present

## 2021-07-03 DIAGNOSIS — F419 Anxiety disorder, unspecified: Secondary | ICD-10-CM | POA: Diagnosis not present

## 2021-07-03 DIAGNOSIS — Z1331 Encounter for screening for depression: Secondary | ICD-10-CM | POA: Diagnosis not present

## 2021-07-03 DIAGNOSIS — R42 Dizziness and giddiness: Secondary | ICD-10-CM | POA: Diagnosis not present

## 2021-07-03 DIAGNOSIS — N3000 Acute cystitis without hematuria: Secondary | ICD-10-CM | POA: Diagnosis not present

## 2021-07-03 MED ORDER — SERTRALINE HCL 100 MG PO TABS
ORAL_TABLET | ORAL | 1 refills | Status: AC
  Filled 2021-07-03: qty 30, 30d supply, fill #0
  Filled 2021-09-04: qty 30, 30d supply, fill #1

## 2021-07-09 ENCOUNTER — Other Ambulatory Visit: Payer: Self-pay

## 2021-07-09 ENCOUNTER — Ambulatory Visit (INDEPENDENT_AMBULATORY_CARE_PROVIDER_SITE_OTHER): Payer: 59

## 2021-07-09 DIAGNOSIS — Z23 Encounter for immunization: Secondary | ICD-10-CM

## 2021-07-09 NOTE — Progress Notes (Signed)
   Covid-19 Vaccination Clinic  Name:  Lynn Sullivan    MRN: [id number] DOB: 1990/12/31  07/09/2021  Lynn Sullivan was observed post Covid-19 immunization for 15 minutes without incident. She was provided with Vaccine Information Sheet and instruction to access the V-Safe system.   Lynn Sullivan was instructed to call 911 with any severe reactions post vaccine: Difficulty breathing  Swelling of face and throat  A fast heartbeat  A bad rash all over body  Dizziness and weakness   Immunizations Administered     Name Date Dose VIS Date Route   PFIZER Comrnaty(Gray TOP) Covid-19 Vaccine 07/09/2021  4:29 PM 0.3 mL 10/30/2020 Intramuscular   Manufacturer: [employer], [employer]   Lot: [id number]   NDC: [phone]

## 2021-07-09 NOTE — Addendum Note (Signed)
Addended by: Johny Drilling on: 07/09/2021 04:43 PM   Modules accepted: Level of Service

## 2021-07-14 ENCOUNTER — Other Ambulatory Visit (HOSPITAL_COMMUNITY): Payer: Self-pay

## 2021-08-07 ENCOUNTER — Other Ambulatory Visit (HOSPITAL_COMMUNITY): Payer: Self-pay

## 2021-09-04 ENCOUNTER — Other Ambulatory Visit (HOSPITAL_COMMUNITY): Payer: Self-pay

## 2021-09-09 DIAGNOSIS — Z111 Encounter for screening for respiratory tuberculosis: Secondary | ICD-10-CM | POA: Diagnosis not present

## 2021-09-14 NOTE — Progress Notes (Deleted)
No chief complaint on file.    HISTORY OF PRESENT ILLNESS:  09/14/21 ALL:  Lynn Sullivan is a 30 y.o. female here today for follow up for migraine with aura. She continued topiramate and Amovig was added for prevention and Nurtec for abortive therapy.    HISTORY (copied from Dr Richrd Humbles previous note)  UPDATE (05/18/21, VRP): Since last visit, doing well until June 2022; then return of headaches, dizziness. Now back on TPX and sertraline since 04/27/21. Daily HA, but now slightly easing up.    PRIOR HPI: 30 year old female here for evaluation of headache, dizziness, numbness.   1 to 2 months ago patient had onset of pressure and throbbing headaches, frontal, sometimes unilateral, lasting hours at a time.  Headaches can be associate with nausea and phonophobia.  Sometimes patient sees floaters and spots with blurred vision.  No prior similar headaches.   Patient having intermittent attacks of dizziness and vertigo with nausea and blurred vision now improved.   Patient also having intermittent numbness and tingling in the face, arms, legs, intermittently for less than a minute at a time.  Symptoms typically affect her during sleep and wake her up, and are alleviated by changing her position.   No other recent triggering or aggravating factors.  Patient has been having some insomnia lately due to anxiety sensations.   REVIEW OF SYSTEMS: Out of a complete 14 system review of symptoms, the patient complains only of the following symptoms, and all other reviewed systems are negative.   ALLERGIES: No Known Allergies   HOME MEDICATIONS: Outpatient Medications Prior to Visit  Medication Sig Dispense Refill   Erenumab-aooe (AIMOVIG) 70 MG/ML SOAJ Inject 70 mg into the skin every 30 (thirty) days. 3 mL 4   fluticasone (FLONASE) 50 MCG/ACT nasal spray USE 2 SPRAYS IN EACH NOSTRIL ONCE A DAY 60 g 11   Iron-Vitamin C (VITRON-C PO)      meclizine (ANTIVERT) 25 MG tablet Take 1/2 - 1  Tablet by mouth every 6 hours 30 tablet 1   Rimegepant Sulfate (NURTEC) 75 MG TBDP Dissolve 1 tablet by mouth daily as needed. 8 tablet 6   sertraline (ZOLOFT) 100 MG tablet TAKE 1 TABLET BY MOUTH ONCE DAILY 30 tablet 2   sertraline (ZOLOFT) 100 MG tablet Take 1 tablet by mouth daily 30 tablet 1   topiramate (TOPAMAX) 50 MG tablet Take 1 Tablet by mouth every night at bedtime 30 tablet 12   No facility-administered medications prior to visit.     PAST MEDICAL HISTORY: Past Medical History:  Diagnosis Date   Anemia    GAD (generalized anxiety disorder)    Palpitations      PAST SURGICAL HISTORY: Past Surgical History:  Procedure Laterality Date   CESAREAN SECTION  2019     FAMILY HISTORY: No family history on file.   SOCIAL HISTORY: Social History   Socioeconomic History   Marital status: Single    Spouse name: Not on file   Number of children: 1   Years of education: Not on file   Highest education level: Associate degree: academic program  Occupational History    Comment: Careers information officer, Med assistant  Tobacco Use   Smoking status: Never   Smokeless tobacco: Never  Substance and Sexual Activity   Alcohol use: Not Currently    Comment: occas   Drug use: Never   Sexual activity: Not on file  Other Topics Concern   Not on file  Social History Narrative  Lives with child   Caffeine- 8 oz soda occass   Social Determinants of Health   Financial Resource Strain: Not on file  Food Insecurity: Not on file  Transportation Needs: Not on file  Physical Activity: Not on file  Stress: Not on file  Social Connections: Not on file  Intimate Partner Violence: Not on file     PHYSICAL EXAM  There were no vitals filed for this visit. There is no height or weight on file to calculate BMI.  Generalized: Well developed, in no acute distress  Cardiology: normal rate and rhythm, no murmur auscultated  Respiratory: clear to auscultation bilaterally     Neurological examination  Mentation: Alert oriented to time, place, history taking. Follows all commands speech and language fluent Cranial nerve II-XII: Pupils were equal round reactive to light. Extraocular movements were full, visual field were full on confrontational test. Facial sensation and strength were normal. Uvula tongue midline. Head turning and shoulder shrug  were normal and symmetric. Motor: The motor testing reveals 5 over 5 strength of all 4 extremities. Good symmetric motor tone is noted throughout.  Sensory: Sensory testing is intact to soft touch on all 4 extremities. No evidence of extinction is noted.  Coordination: Cerebellar testing reveals good finger-nose-finger and heel-to-shin bilaterally.  Gait and station: Gait is normal. Tandem gait is normal. Romberg is negative. No drift is seen.  Reflexes: Deep tendon reflexes are symmetric and normal bilaterally.    DIAGNOSTIC DATA (LABS, IMAGING, TESTING) - I reviewed patient records, labs, notes, testing and imaging myself where available.  No results found for: WBC, HGB, HCT, MCV, PLT No results found for: NA, K, CL, CO2, GLUCOSE, BUN, CREATININE, CALCIUM, PROT, ALBUMIN, AST, ALT, ALKPHOS, BILITOT, GFRNONAA, GFRAA No results found for: CHOL, HDL, LDLCALC, LDLDIRECT, TRIG, CHOLHDL No results found for: TGGY6R No results found for: VITAMINB12 No results found for: TSH  No flowsheet data found.   No flowsheet data found.   ASSESSMENT AND PLAN  30 y.o. year old female  has a past medical history of Anemia, GAD (generalized anxiety disorder), and Palpitations. here with    No diagnosis found.   No orders of the defined types were placed in this encounter.    No orders of the defined types were placed in this encounter.     Shawnie Dapper, MSN, FNP-C 09/14/2021, 7:51 AM  [facility] [address] [place], [place] [address] [phone]

## 2021-09-14 NOTE — Patient Instructions (Incomplete)
Below is our plan: ? ?We will *** ? ?Please make sure you are staying well hydrated. I recommend 50-60 ounces daily. Well balanced diet and regular exercise encouraged. Consistent sleep schedule with 6-8 hours recommended.  ? ?Please continue follow up with care team as directed.  ? ?Follow up with *** in *** ? ?You may receive a survey regarding today's visit. I encourage you to leave honest feed back as I do use this information to improve patient care. Thank you for seeing me today!  ? ?Migraine with aura: There is increased risk for stroke in women with migraine with aura and a contraindication for the combined contraceptive pill for use by women who have migraine with aura. The risk for women with migraine without aura is lower. However other risk factors like smoking are far more likely to increase stroke risk than migraine. There is a recommendation for no smoking and for the use of OCPs without estrogen such as progestogen only pills particularly for women with migraine with aura.. People who have migraine headaches with auras may be 3 times more likely to have a stroke caused by a blood clot, compared to migraine patients who don't see auras. Women who take hormone-replacement therapy may be 30 percent more likely to suffer a clot-based stroke than women not taking medication containing estrogen. Other risk factors like smoking and high blood pressure may be  much more important.  ? ?Magnesium: may help with headaches are aura, the best evidence for magnesium is for migraine with aura is its thought to stop the cortical spreading depression we believe is the pathophysiology of migraine aura. ? ?To prevent or relieve headaches, try the following: ?Cool Compress. Lie down and place a cool compress on your head.  ?Avoid headache triggers. If certain foods or odors seem to have triggered your migraines in the past, avoid them. A headache diary might help you identify triggers.  ?Include physical activity in  your daily routine. Try a daily walk or other moderate aerobic exercise.  ?Manage stress. Find healthy ways to cope with the stressors, such as delegating tasks on your to-do list.  ?Practice relaxation techniques. Try deep breathing, yoga, massage and visualization.  ?Eat regularly. Eating regularly scheduled meals and maintaining a healthy diet might help prevent headaches. Also, drink plenty of fluids.  ?Follow a regular sleep schedule. Sleep deprivation might contribute to headaches ?Consider biofeedback. With this mind-body technique, you learn to control certain bodily functions -- such as muscle tension, heart rate and blood pressure -- to prevent headaches or reduce headache pain.  ?  ?  ?Proceed to emergency room if you experience new or worsening symptoms or symptoms do not resolve, if you have new neurologic symptoms or if headache is severe, or for any concerning symptom.  ?

## 2021-09-15 ENCOUNTER — Telehealth: Payer: Self-pay | Admitting: Diagnostic Neuroimaging

## 2021-09-17 ENCOUNTER — Ambulatory Visit: Payer: 59 | Admitting: Family Medicine

## 2021-09-17 NOTE — Telephone Encounter (Signed)
ERROR

## 2021-09-23 DIAGNOSIS — Z0184 Encounter for antibody response examination: Secondary | ICD-10-CM | POA: Diagnosis not present

## 2021-09-23 DIAGNOSIS — Z111 Encounter for screening for respiratory tuberculosis: Secondary | ICD-10-CM | POA: Diagnosis not present

## 2021-09-23 DIAGNOSIS — Z1159 Encounter for screening for other viral diseases: Secondary | ICD-10-CM | POA: Diagnosis not present

## 2021-10-12 ENCOUNTER — Other Ambulatory Visit (HOSPITAL_COMMUNITY): Payer: Self-pay

## 2021-10-12 DIAGNOSIS — G43909 Migraine, unspecified, not intractable, without status migrainosus: Secondary | ICD-10-CM | POA: Diagnosis not present

## 2021-10-12 DIAGNOSIS — Z6841 Body Mass Index (BMI) 40.0 and over, adult: Secondary | ICD-10-CM | POA: Diagnosis not present

## 2021-10-12 DIAGNOSIS — Z0001 Encounter for general adult medical examination with abnormal findings: Secondary | ICD-10-CM | POA: Diagnosis not present

## 2021-11-04 ENCOUNTER — Telehealth: Payer: Self-pay | Admitting: *Deleted

## 2021-11-04 NOTE — Telephone Encounter (Signed)
Nurtec PA on CMM. Unable to complete: Patient does not have active coverage with the payer. Called patient, LVM requesting call back with current insurance information.

## 2021-11-05 NOTE — Telephone Encounter (Signed)
Have not gotten call from patient, called pharmacy help # , spoke with Lynn Sullivan who stated she shows no coverage for this patient. Will call patient again later today.

## 2021-11-25 ENCOUNTER — Other Ambulatory Visit (HOSPITAL_COMMUNITY): Payer: Self-pay

## 2021-12-02 ENCOUNTER — Telehealth: Payer: Self-pay | Admitting: *Deleted

## 2021-12-02 DIAGNOSIS — G43109 Migraine with aura, not intractable, without status migrainosus: Secondary | ICD-10-CM

## 2021-12-02 MED ORDER — AIMOVIG 70 MG/ML ~~LOC~~ SOAJ: 70.0000 mg | SUBCUTANEOUS | 4 refills | Status: AC

## 2021-12-02 NOTE — Telephone Encounter (Signed)
Received patient application for PAP for Aimovig 70 mg. Patient has no insurance. Form on Dr [employer] desk for signature. Rx e scribed to [employer]  per application requirements.

## 2021-12-02 NOTE — Telephone Encounter (Signed)
Aimovig PAP form signed, faxed to [employer]. Received confirmation.

## 2021-12-08 ENCOUNTER — Ambulatory Visit: Payer: Self-pay | Admitting: Family Medicine

## 2021-12-08 ENCOUNTER — Telehealth: Payer: Self-pay | Admitting: *Deleted

## 2021-12-08 NOTE — Telephone Encounter (Addendum)
Error- PA not needed, not done because patient doesn't have any insurance.

## 2021-12-08 NOTE — Telephone Encounter (Signed)
Letter of no insurance faxed to [employer]. Received confirmation.,

## 2021-12-08 NOTE — Telephone Encounter (Addendum)
Patient returned call and stated she talked to [employer] and was told they need a letter form MD stating the patient doesn't have insurance, and Aimovig needs PA. She is unemployed and going to school, has no insurance. I informed her we will write letter and start PA today. Patient verbalized understanding, appreciation. Note: PA cannot be done on a  medication when there is no insurance; noted that in the letter. Letter on MD desk for review, signature.

## 2021-12-08 NOTE — Telephone Encounter (Signed)
LVM inquiring if she has heard back from [employer] PAP on Aimovig PA, and if she has received her Aimovig. Left # for call back.

## 2021-12-08 NOTE — Telephone Encounter (Signed)
Pt returned phone call. Spoke with [employer] they received my applicatin and  need letter for physician stating that pt has no income. Transfer call to [facility]

## 2021-12-09 NOTE — Telephone Encounter (Signed)
Received fax from [employer] P[employer] asking for PA approval letter for last plan year and proof of each household member's gross income. I called patient and advised her to send the income information. Patient verbalized understanding, appreciation. I faxed the PA letter of approval for 05/18/2021 - 11/16/2021. Received confirmation.

## 2021-12-15 ENCOUNTER — Telehealth: Payer: Self-pay | Admitting: Diagnostic Neuroimaging

## 2021-12-15 NOTE — Telephone Encounter (Signed)
Patient called today to check status of Aimovig. I called left detailed VM. I spoke with her on 12/09/21 and advised her the [employer] PAP needs proof of every household members gross income. I advised she send it to them. I advised I have sent in everything [employer] has asked for for her PAP. Gave her [employer] phone # to call and check on status.

## 2021-12-15 NOTE — Telephone Encounter (Signed)
Called patient who stated [employer] claims they have not received any further information. I advised her I will fax everything again. Patient verbalized understanding, appreciation. Letter of no insurance, past PA from year 2022 and form [employer] sent requesting this information faxed to [employer] again. Received confirmation.

## 2021-12-15 NOTE — Telephone Encounter (Signed)
Pt is asking for a call to discuss the status of her Aimovig paperwork

## 2021-12-15 NOTE — Telephone Encounter (Signed)
Please refer to phone note dated 12/02/21, Aimovig PAP.

## 2021-12-15 NOTE — Telephone Encounter (Signed)
Pt returned phone call, would like a call back.  

## 2021-12-17 NOTE — Telephone Encounter (Signed)
Called patient who stated [employer] said they still have not received my fax. She asked me to e mail pages to her: [email or web address] Paperwork e mail, received confirmation.

## 2021-12-17 NOTE — Telephone Encounter (Signed)
Pt is asking for a call back from Leilani Able, RN re: her Aimovig paperwork

## 2021-12-25 ENCOUNTER — Other Ambulatory Visit: Payer: Self-pay | Admitting: Diagnostic Neuroimaging

## 2021-12-25 ENCOUNTER — Other Ambulatory Visit (HOSPITAL_COMMUNITY): Payer: Self-pay

## 2021-12-28 ENCOUNTER — Other Ambulatory Visit (HOSPITAL_COMMUNITY): Payer: Self-pay

## 2021-12-29 ENCOUNTER — Other Ambulatory Visit (HOSPITAL_COMMUNITY): Payer: Self-pay

## 2022-01-04 NOTE — Telephone Encounter (Signed)
Received fax from [employer] re: patient's Aimovig enrollment status is pending.

## 2022-01-07 ENCOUNTER — Telehealth: Payer: Self-pay | Admitting: *Deleted

## 2022-01-07 NOTE — Telephone Encounter (Addendum)
Received fax from [place] re: patient reported dizziness, nausea, strong migraines. Called her and she stated she told [employer] that is how she feels off of the medicine. She hasn't gotten PAP, stated they have been giving her a difficult time asking for letter of no income. She stated they already have letter.  She  hasn't had injection this year. She is waiting on Medicaid, stated she is taking OTC for migraines, but meclizine is most helpful. She stopped topamax due to side effects of feeling disconnected on it. I advised she call with new insurance and we'll get PA.  Patient verbalized understanding, appreciation.

## 2022-01-07 NOTE — Telephone Encounter (Signed)
Called [employer] to check on PAP, spoke with Jentell who stated they have everything they need. Once decision is made they will contact office and send her a letter of decision. Usually turn around is 72 hours. Called patient and advised her of above conversation . Patient verbalized understanding, appreciation.

## 2022-04-30 IMAGING — US US EXTREM LOW VENOUS*L*
1 series · 13 of 24 positions shown · non-contrast
Comparison: None.

CLINICAL DATA: Left lower extremity edema for the past 1-2 weeks.
Evaluate for DVT.



[Series 1: us extrem low venous*left* · 13 of 38 slices shown]
[im 1/38]
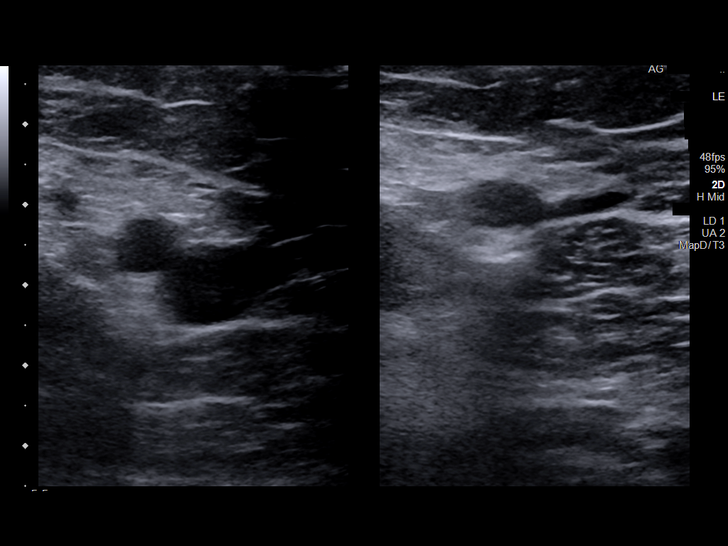
[im 4/38]
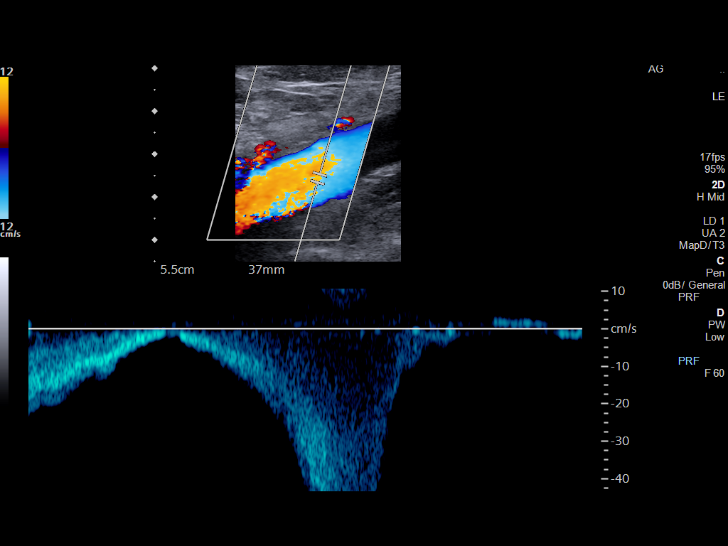
[im 7/38]
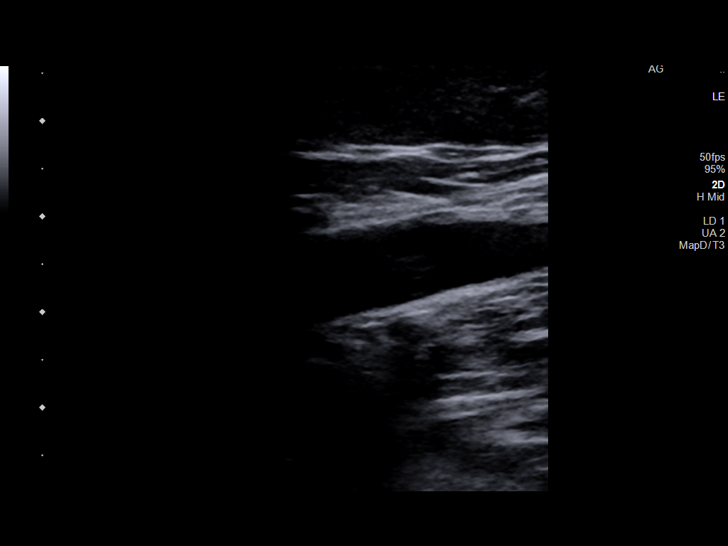
[im 10/38]
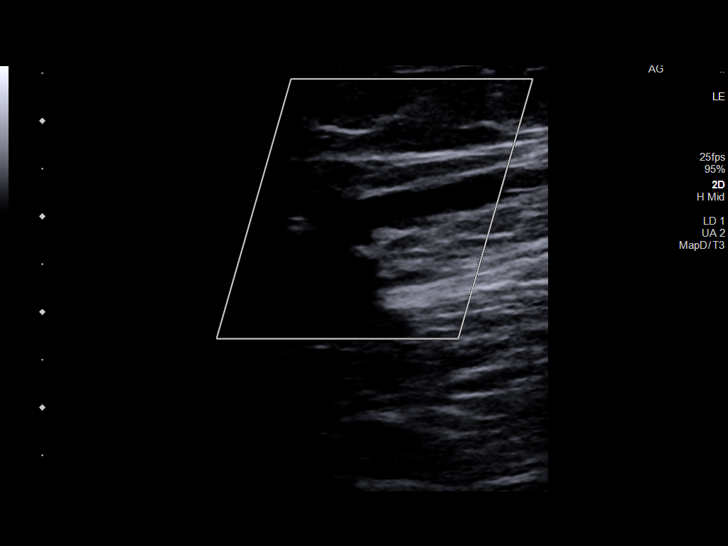
[im 13/38]
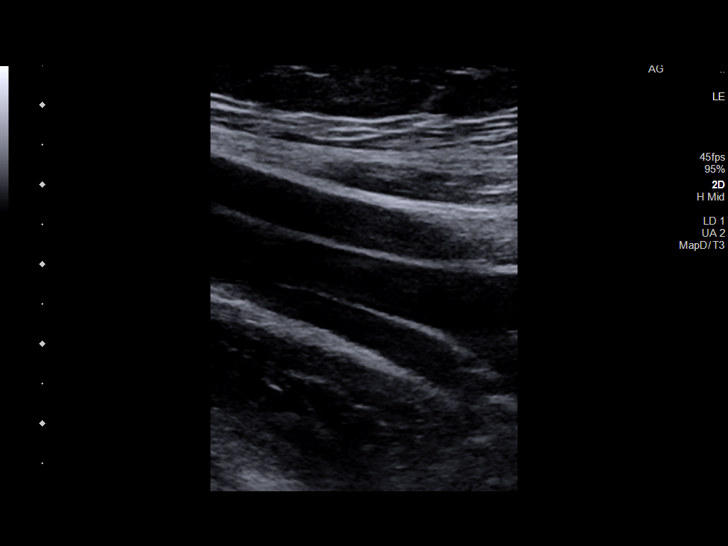
[im 17/38]
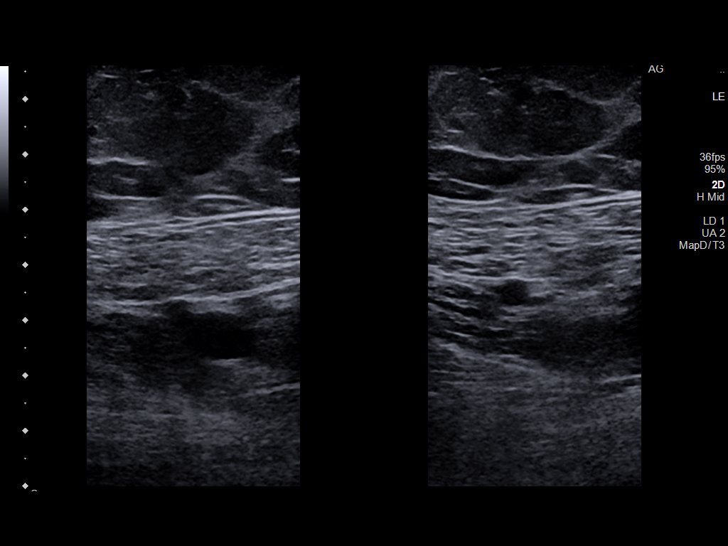
[im 20/38]
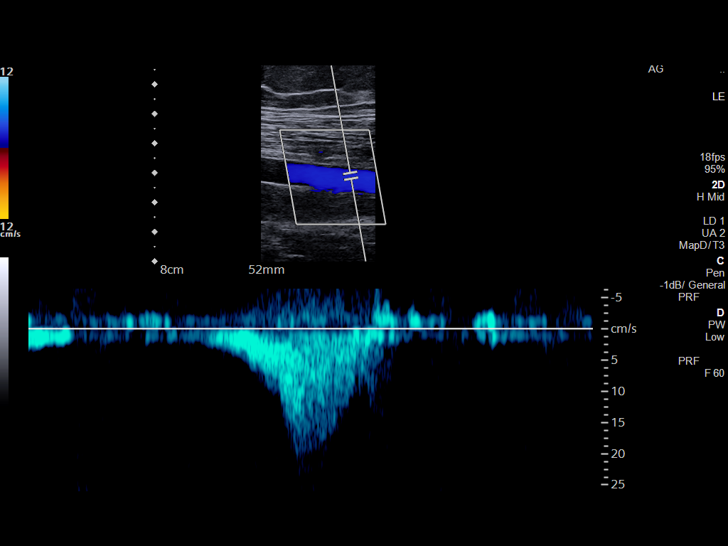
[im 21/38]
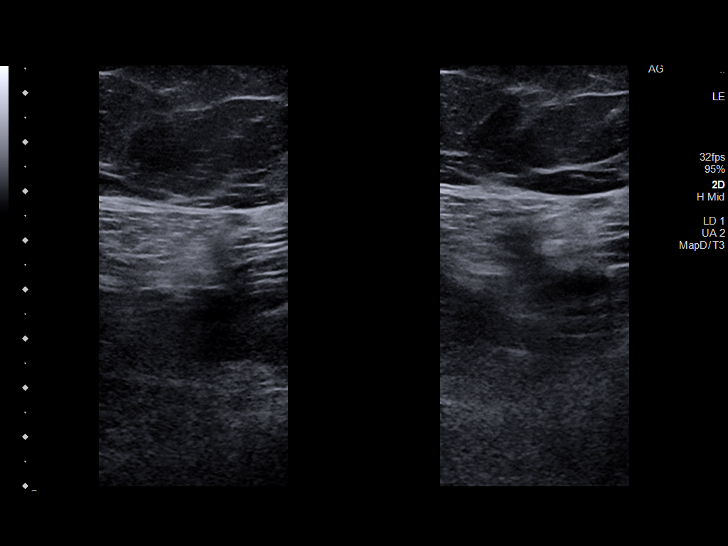
[im 25/38]
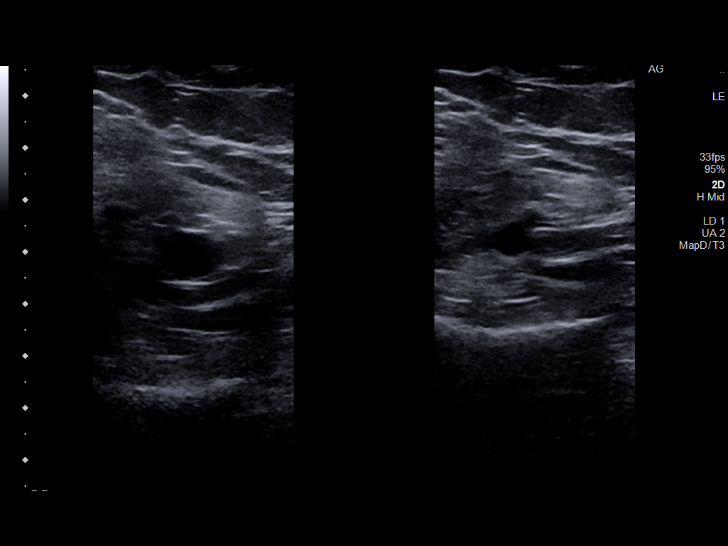
[im 28/38]
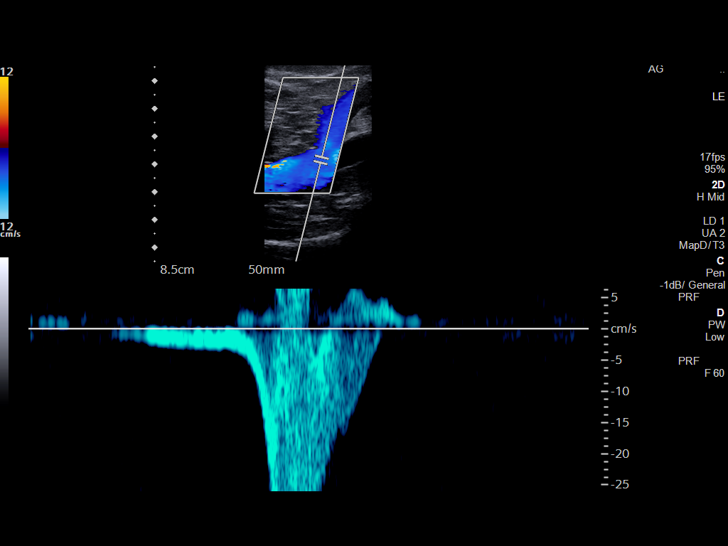
[im 31/38]
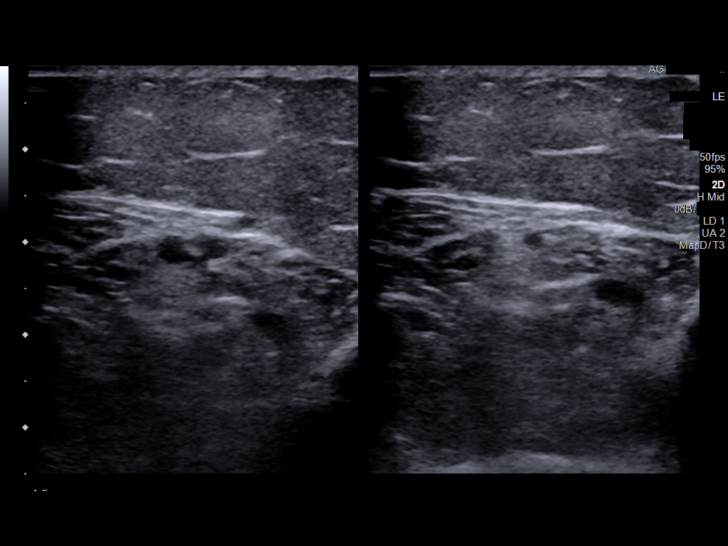
[im 34/38]
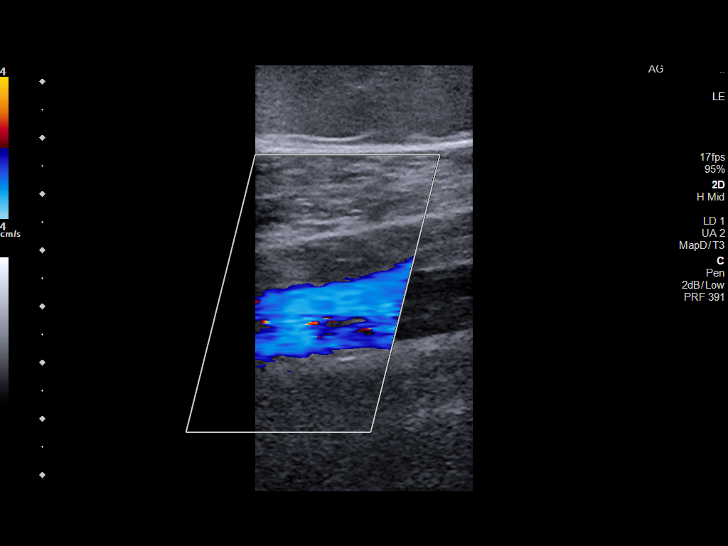
[im 38/38]
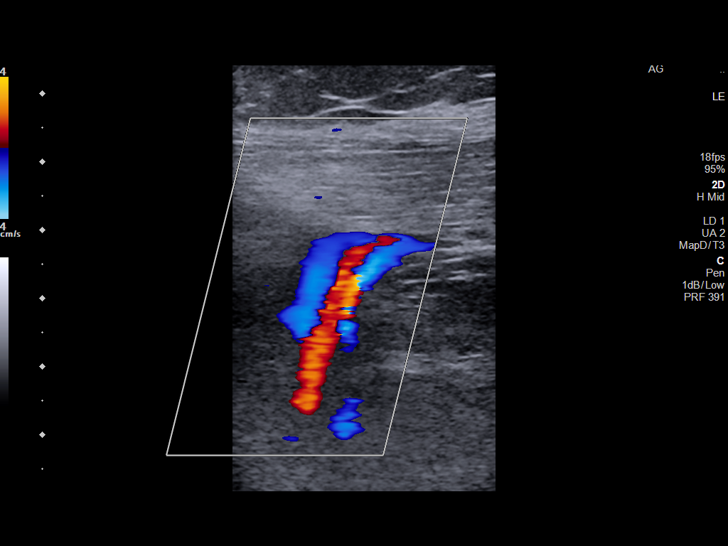

[13 of 24 positions shown; findings below may reference images not displayed]

FINDINGS: Contralateral Common Femoral Vein: Respiratory phasicity is normal
and symmetric with the symptomatic side. No evidence of thrombus.
Normal compressibility.

Common Femoral Vein: No evidence of thrombus. Normal
compressibility, respiratory phasicity and response to augmentation.

Saphenofemoral Junction: No evidence of thrombus. Normal
compressibility and flow on color Doppler imaging.

Profunda Femoral Vein: No evidence of thrombus. Normal
compressibility and flow on color Doppler imaging.

Femoral Vein: No evidence of thrombus. Normal compressibility,
respiratory phasicity and response to augmentation.

Popliteal Vein: No evidence of thrombus. Normal compressibility,
respiratory phasicity and response to augmentation.

Calf Veins: No evidence of thrombus. Normal compressibility and flow
on color Doppler imaging.

Superficial Great Saphenous Vein: No evidence of thrombus. Normal
compressibility.

Venous Reflux:  None.

Other Findings:  None.
IMPRESSION: No evidence of DVT within the left lower extremity.
